# Patient Record
Sex: Female | Born: 1935 | Race: White | Hispanic: No | Marital: Married
Health system: Southern US, Community
[De-identification: ages and names within clinical notes are randomized; demographics above are authoritative.]

---

## 2005-05-04 ENCOUNTER — Ambulatory Visit: Payer: Self-pay | Admitting: Internal Medicine

## 2006-05-05 ENCOUNTER — Ambulatory Visit: Payer: Self-pay | Admitting: Internal Medicine

## 2007-05-10 ENCOUNTER — Ambulatory Visit: Payer: Self-pay | Admitting: Internal Medicine

## 2007-07-04 ENCOUNTER — Emergency Department: Payer: Self-pay | Admitting: Internal Medicine

## 2007-07-20 ENCOUNTER — Other Ambulatory Visit: Payer: Self-pay

## 2007-07-21 ENCOUNTER — Inpatient Hospital Stay: Payer: Self-pay | Admitting: Internal Medicine

## 2008-05-14 ENCOUNTER — Ambulatory Visit: Payer: Self-pay | Admitting: Internal Medicine

## 2008-06-02 ENCOUNTER — Inpatient Hospital Stay: Payer: Self-pay | Admitting: Internal Medicine

## 2009-01-27 IMAGING — CT CT CHEST W/ CM
2 series · 16 of 31 positions shown, 20 images · non-contrast
Comparison: none

REASON FOR EXAM: dyspnea
COMMENTS:

[Series 4: soft tissue · axial · 0.67mm/px · z∈[-618,-532]mm · 3 of 97 slices shown]
[im 8/97  mediastinal]
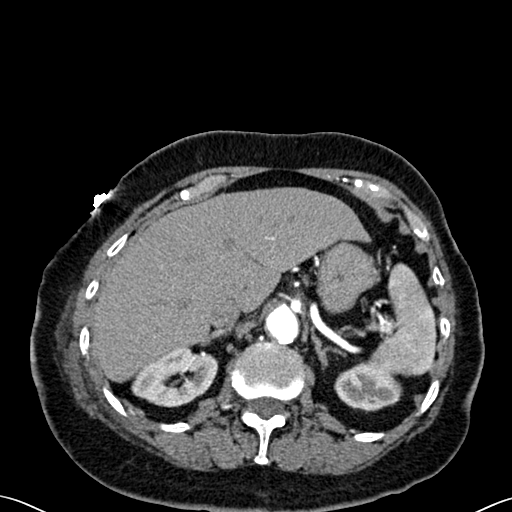
[im 23/97  mediastinal]
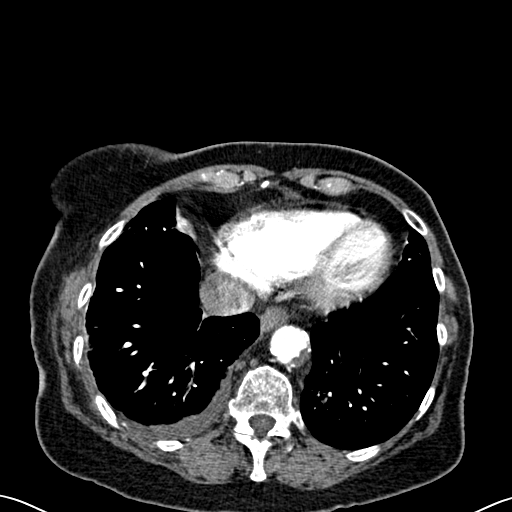
[im 37/97  mediastinal]
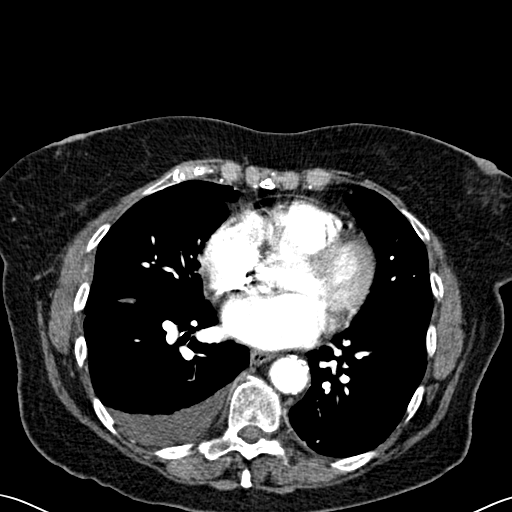

[Series 5: lung windows · axial · 0.67mm/px · z∈[-612,-376]mm · 13 of 95 slices shown, 17 images]
[im 8/95  mediastinal]
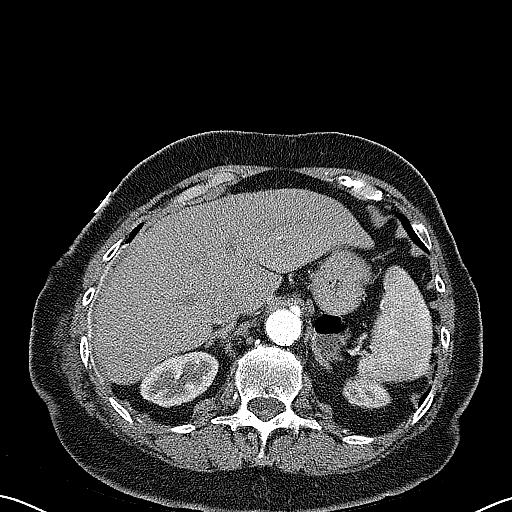
[im 8/95  lung]
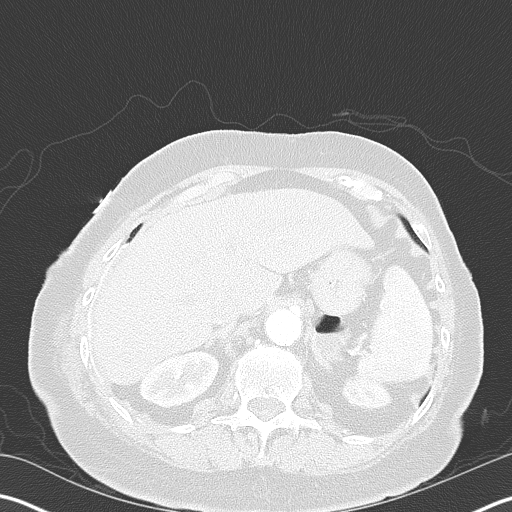
[im 15/95  lung]
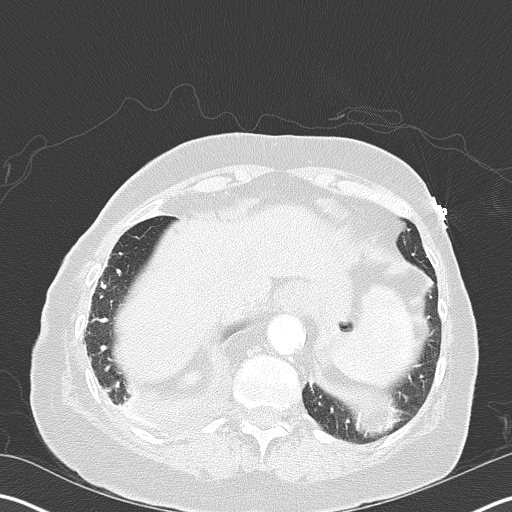
[im 22/95  lung]
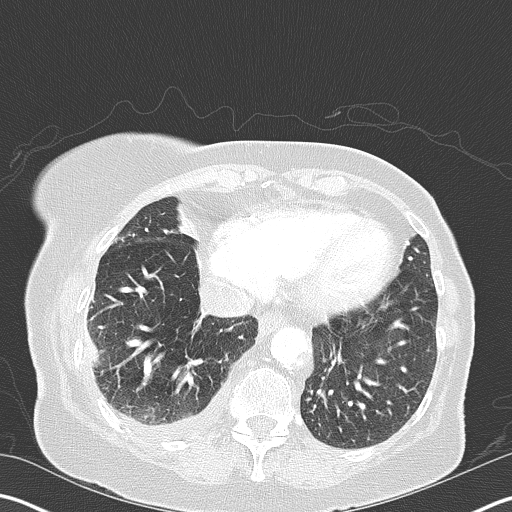
[im 29/95  lung]
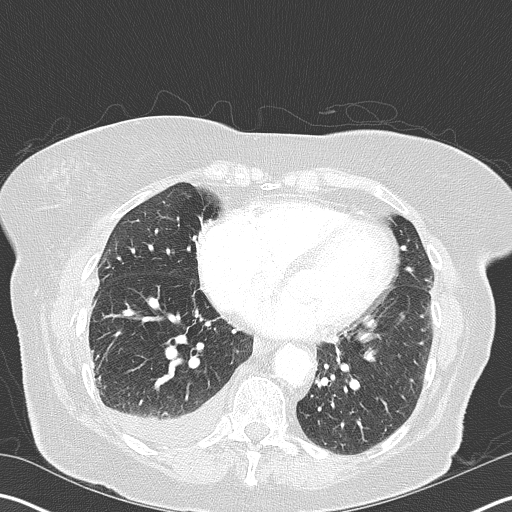
[im 37/95  mediastinal]
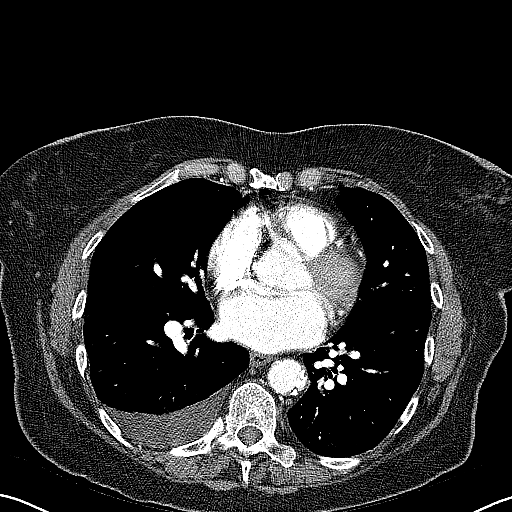
[im 37/95  lung]
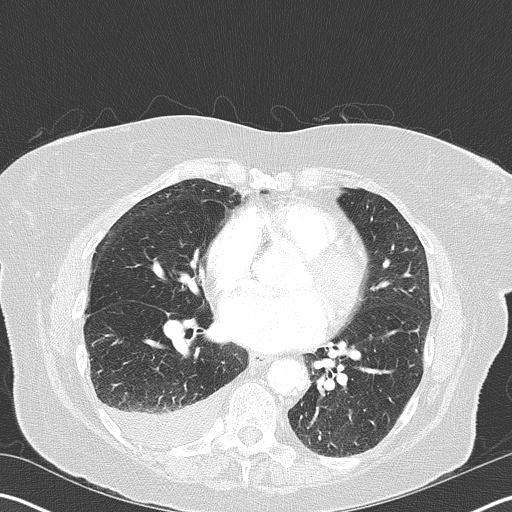
[im 44/95  lung]
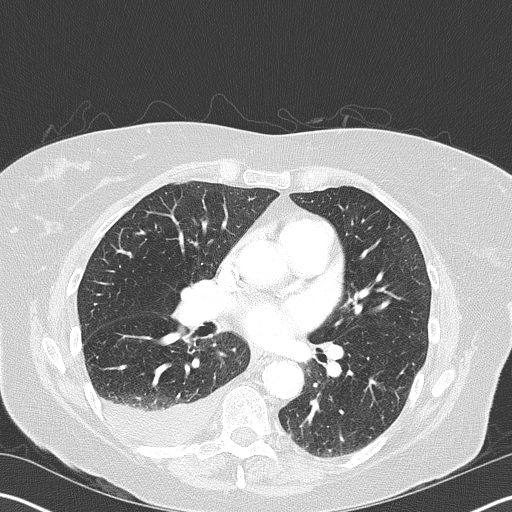
[im 48/95  lung]
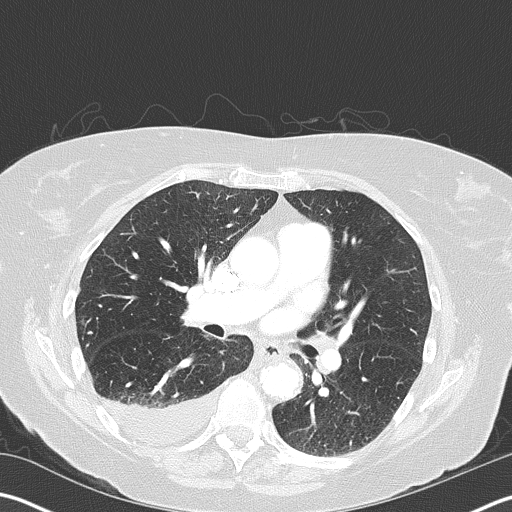
[im 51/95  lung]
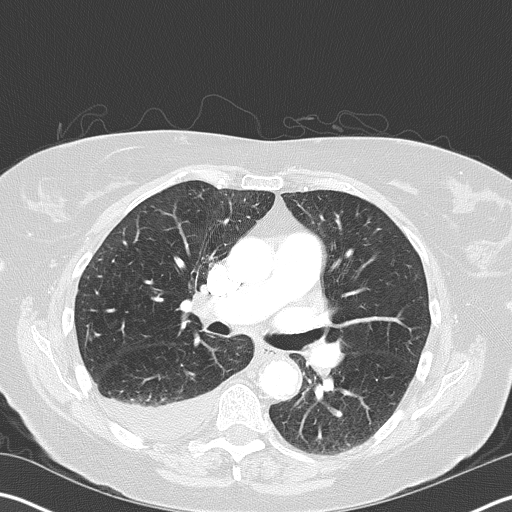
[im 58/95  mediastinal]
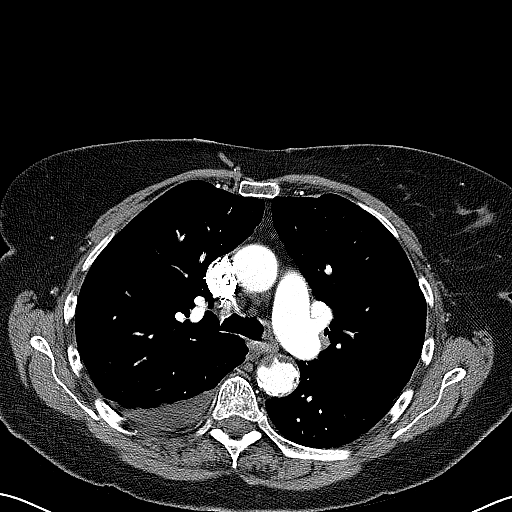
[im 58/95  lung]
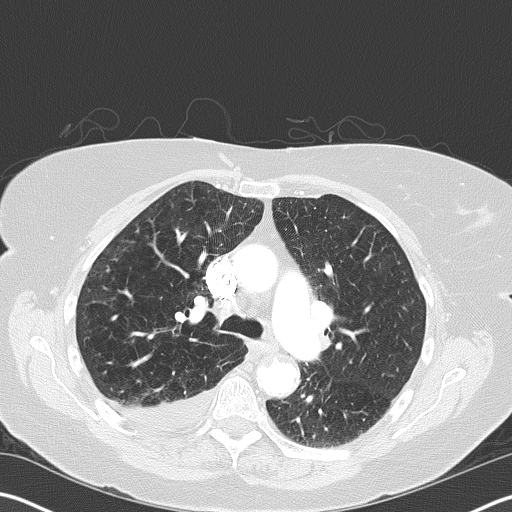
[im 66/95  lung]
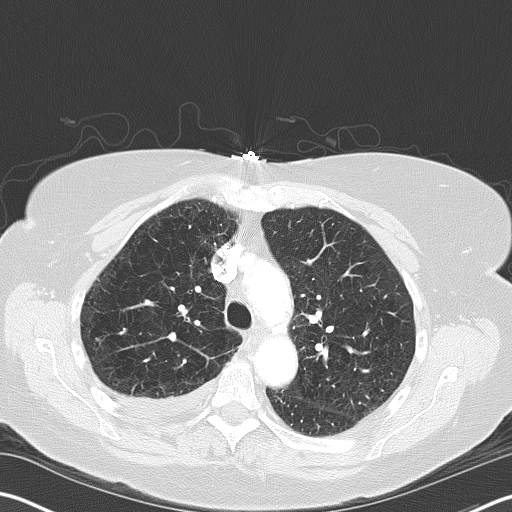
[im 73/95  lung]
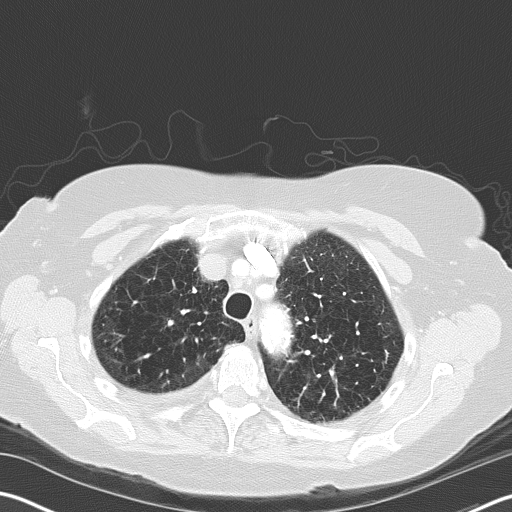
[im 80/95  lung]
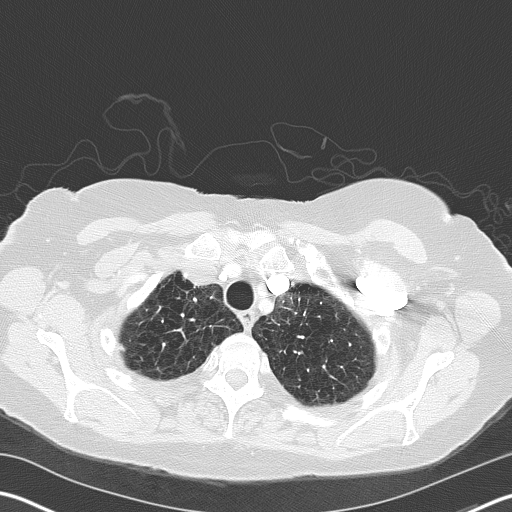
[im 87/95  mediastinal]
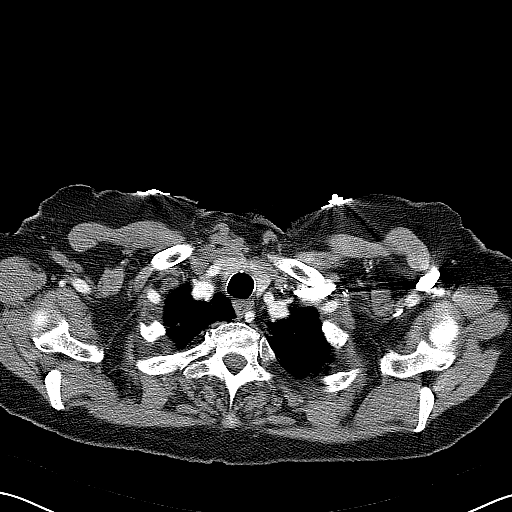
[im 87/95  lung]
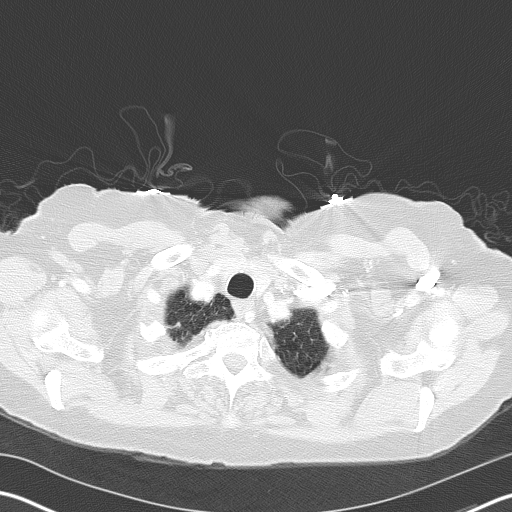

[16 of 31 positions shown; findings below may reference images not displayed]

PROCEDURE:     CT  - CT CHEST (FOR PE) W  - July 20, 2007  [DATE]

RESULT:     IV contrast enhanced Chest CT is obtained. The thoracic aorta is
atherosclerotic.  Borderline cardiomegaly and coronary artery disease is
noted.  No evidence of pulmonary embolus.  RIGHT sided pleural effusion is
noted. Diffuse interstitial prominence is noted.  No evidence of pulmonary
alveolar edema.  It may be wise to perform follow-up chest x-rays to exclude
developing congestive heart failure.
IMPRESSION: 1.     No evidence of pulmonary embolus.
2.     Coronary artery disease.
3.     Cardiomegaly with RIGHT sided pleural effusion. Mild interstitial
prominence. It may be wise to perform follow-up chest x-rays to exclude
developing congestive heart failure.  Again, there is no evidence of
pulmonary embolus.

## 2009-05-18 ENCOUNTER — Ambulatory Visit: Payer: Self-pay | Admitting: Internal Medicine

## 2010-06-17 ENCOUNTER — Ambulatory Visit: Payer: Self-pay | Admitting: Internal Medicine

## 2011-07-20 ENCOUNTER — Ambulatory Visit: Payer: Self-pay | Admitting: Internal Medicine

## 2012-02-09 ENCOUNTER — Ambulatory Visit: Payer: Self-pay | Admitting: Internal Medicine

## 2012-02-13 ENCOUNTER — Inpatient Hospital Stay: Payer: Self-pay | Admitting: Internal Medicine

## 2012-02-13 LAB — BASIC METABOLIC PANEL
Anion Gap: 7 (ref 7–16)
BUN: 50 mg/dL — ABNORMAL HIGH (ref 7–18)
Chloride: 86 mmol/L — ABNORMAL LOW (ref 98–107)
Creatinine: 1.54 mg/dL — ABNORMAL HIGH (ref 0.60–1.30)
EGFR (African American): 36 — ABNORMAL LOW
EGFR (Non-African Amer.): 31 — ABNORMAL LOW
Osmolality: 268 (ref 275–301)
Potassium: 5.7 mmol/L — ABNORMAL HIGH (ref 3.5–5.1)
Sodium: 126 mmol/L — ABNORMAL LOW (ref 136–145)

## 2012-02-13 LAB — COMPREHENSIVE METABOLIC PANEL
Anion Gap: 10 (ref 7–16)
Calcium, Total: 9.1 mg/dL (ref 8.5–10.1)
Chloride: 85 mmol/L — ABNORMAL LOW (ref 98–107)
Co2: 30 mmol/L (ref 21–32)
EGFR (African American): 40 — ABNORMAL LOW
EGFR (Non-African Amer.): 35 — ABNORMAL LOW
Osmolality: 263 (ref 275–301)
Potassium: 6.4 mmol/L — ABNORMAL HIGH (ref 3.5–5.1)
SGPT (ALT): 25 U/L (ref 12–78)
Sodium: 125 mmol/L — ABNORMAL LOW (ref 136–145)
Total Protein: 7.3 g/dL (ref 6.4–8.2)

## 2012-02-13 LAB — CBC WITH DIFFERENTIAL/PLATELET
Basophil #: 0.1 10*3/uL (ref 0.0–0.1)
Basophil %: 0.6 %
Eosinophil #: 0.1 10*3/uL (ref 0.0–0.7)
Eosinophil %: 0.2 %
Lymphocyte %: 21.5 %
MCH: 30.8 pg (ref 26.0–34.0)
MCHC: 32.7 g/dL (ref 32.0–36.0)
MCV: 94 fL (ref 80–100)
Monocyte #: 1.3 x10 3/mm — ABNORMAL HIGH (ref 0.2–0.9)
Monocyte %: 5.2 %
Neutrophil %: 72.5 %
Platelet: 171 10*3/uL (ref 150–440)
RBC: 4.5 10*6/uL (ref 3.80–5.20)
RDW: 13.3 % (ref 11.5–14.5)

## 2012-02-13 LAB — TROPONIN I: Troponin-I: 0.04 ng/mL

## 2012-02-14 LAB — APTT
Activated PTT: 153.3 secs — ABNORMAL HIGH (ref 23.6–35.9)
Activated PTT: 92.6 secs — ABNORMAL HIGH (ref 23.6–35.9)

## 2012-02-14 LAB — CBC WITH DIFFERENTIAL/PLATELET
Comment - H1-Com1: NORMAL
HCT: 39.3 % (ref 35.0–47.0)
HGB: 13 g/dL (ref 12.0–16.0)
Lymphocytes: 16 %
MCHC: 33.2 g/dL (ref 32.0–36.0)
Monocytes: 6 %
Myelocyte: 1 %
RBC: 4.18 10*6/uL (ref 3.80–5.20)
RDW: 13.3 % (ref 11.5–14.5)
Segmented Neutrophils: 75 %

## 2012-02-14 LAB — BASIC METABOLIC PANEL
Anion Gap: 8 (ref 7–16)
Calcium, Total: 8.8 mg/dL (ref 8.5–10.1)
Chloride: 85 mmol/L — ABNORMAL LOW (ref 98–107)
Co2: 34 mmol/L — ABNORMAL HIGH (ref 21–32)
Creatinine: 1.58 mg/dL — ABNORMAL HIGH (ref 0.60–1.30)
EGFR (African American): 35 — ABNORMAL LOW
Sodium: 127 mmol/L — ABNORMAL LOW (ref 136–145)

## 2012-02-14 LAB — MAGNESIUM: Magnesium: 1.8 mg/dL

## 2012-02-15 LAB — CBC WITH DIFFERENTIAL/PLATELET
Comment - H1-Com3: NORMAL
HCT: 37.8 % (ref 35.0–47.0)
Lymphocytes: 15 %
Monocytes: 4 %
Myelocyte: 1 %
Platelet: 148 10*3/uL — ABNORMAL LOW (ref 150–440)
RBC: 4.01 10*6/uL (ref 3.80–5.20)
RDW: 13.2 % (ref 11.5–14.5)
Segmented Neutrophils: 67 %
Variant Lymphocyte - H1-Rlymph: 8 %
WBC: 23.4 10*3/uL — ABNORMAL HIGH (ref 3.6–11.0)

## 2012-02-15 LAB — BASIC METABOLIC PANEL
Anion Gap: 13 (ref 7–16)
BUN: 51 mg/dL — ABNORMAL HIGH (ref 7–18)
Calcium, Total: 8.7 mg/dL (ref 8.5–10.1)
Co2: 33 mmol/L — ABNORMAL HIGH (ref 21–32)
Creatinine: 1.27 mg/dL (ref 0.60–1.30)
EGFR (African American): 46 — ABNORMAL LOW

## 2012-02-15 LAB — APTT: Activated PTT: 116.5 secs — ABNORMAL HIGH (ref 23.6–35.9)

## 2012-02-17 LAB — EXPECTORATED SPUTUM ASSESSMENT W GRAM STAIN, RFLX TO RESP C

## 2012-02-19 LAB — CULTURE, BLOOD (SINGLE)

## 2012-03-11 ENCOUNTER — Ambulatory Visit: Payer: Self-pay | Admitting: Internal Medicine

## 2012-03-11 DEATH — deceased

## 2013-08-23 IMAGING — CR DG CHEST 1V PORT
1 series · 1 of 1 positions shown · non-contrast
Comparison: none

REASON FOR EXAM: shortness of breath
COMMENTS:

[portable]
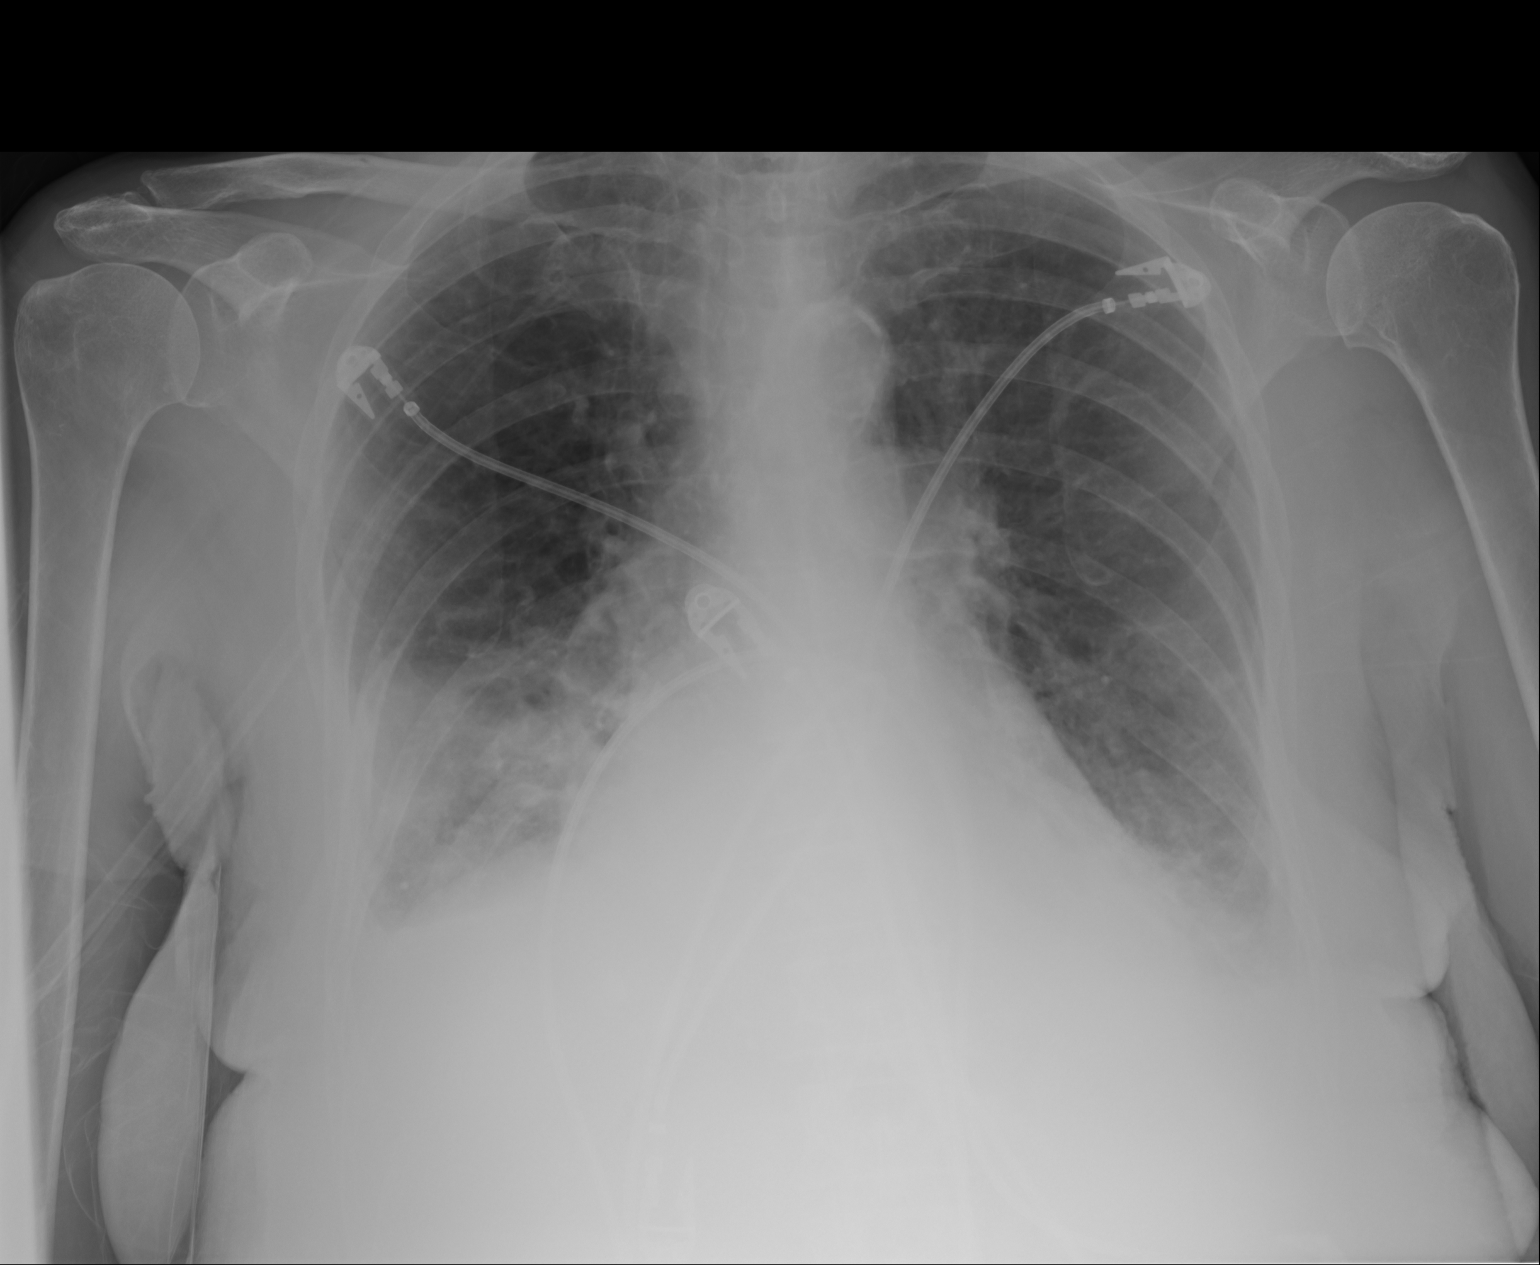

[1 of 1 positions shown; findings below may reference images not displayed]

PROCEDURE:     DXR - DXR PORTABLE CHEST SINGLE VIEW  - February 13, 2012 [DATE]

RESULT:     Comparison is made to the study 02 June, 2008.

There is increased density at the right lung base consistent with right lung
base pneumonia. The lungs are hyperinflated consistent with COPD.
Atherosclerotic calcification is present. The cardiac silhouette is
borderline enlarged. The left lung base is poorly seen.
IMPRESSION: 1.     Findings concerning for developing right lung base pneumonia in a
patient with borderline cardiomegaly and COPD.

[REDACTED]

## 2013-08-25 IMAGING — CR DG CHEST 1V PORT
1 series · 1 of 1 positions shown · non-contrast
Comparison: none

REASON FOR EXAM: COPD, Pneumonia
COMMENTS:

PROCEDURE:     DXR - DXR PORTABLE CHEST SINGLE VIEW  - February 15, 2012  [DATE]
RESULT:     Comparison is made to prior study dated 02/13/2012.

[portable]
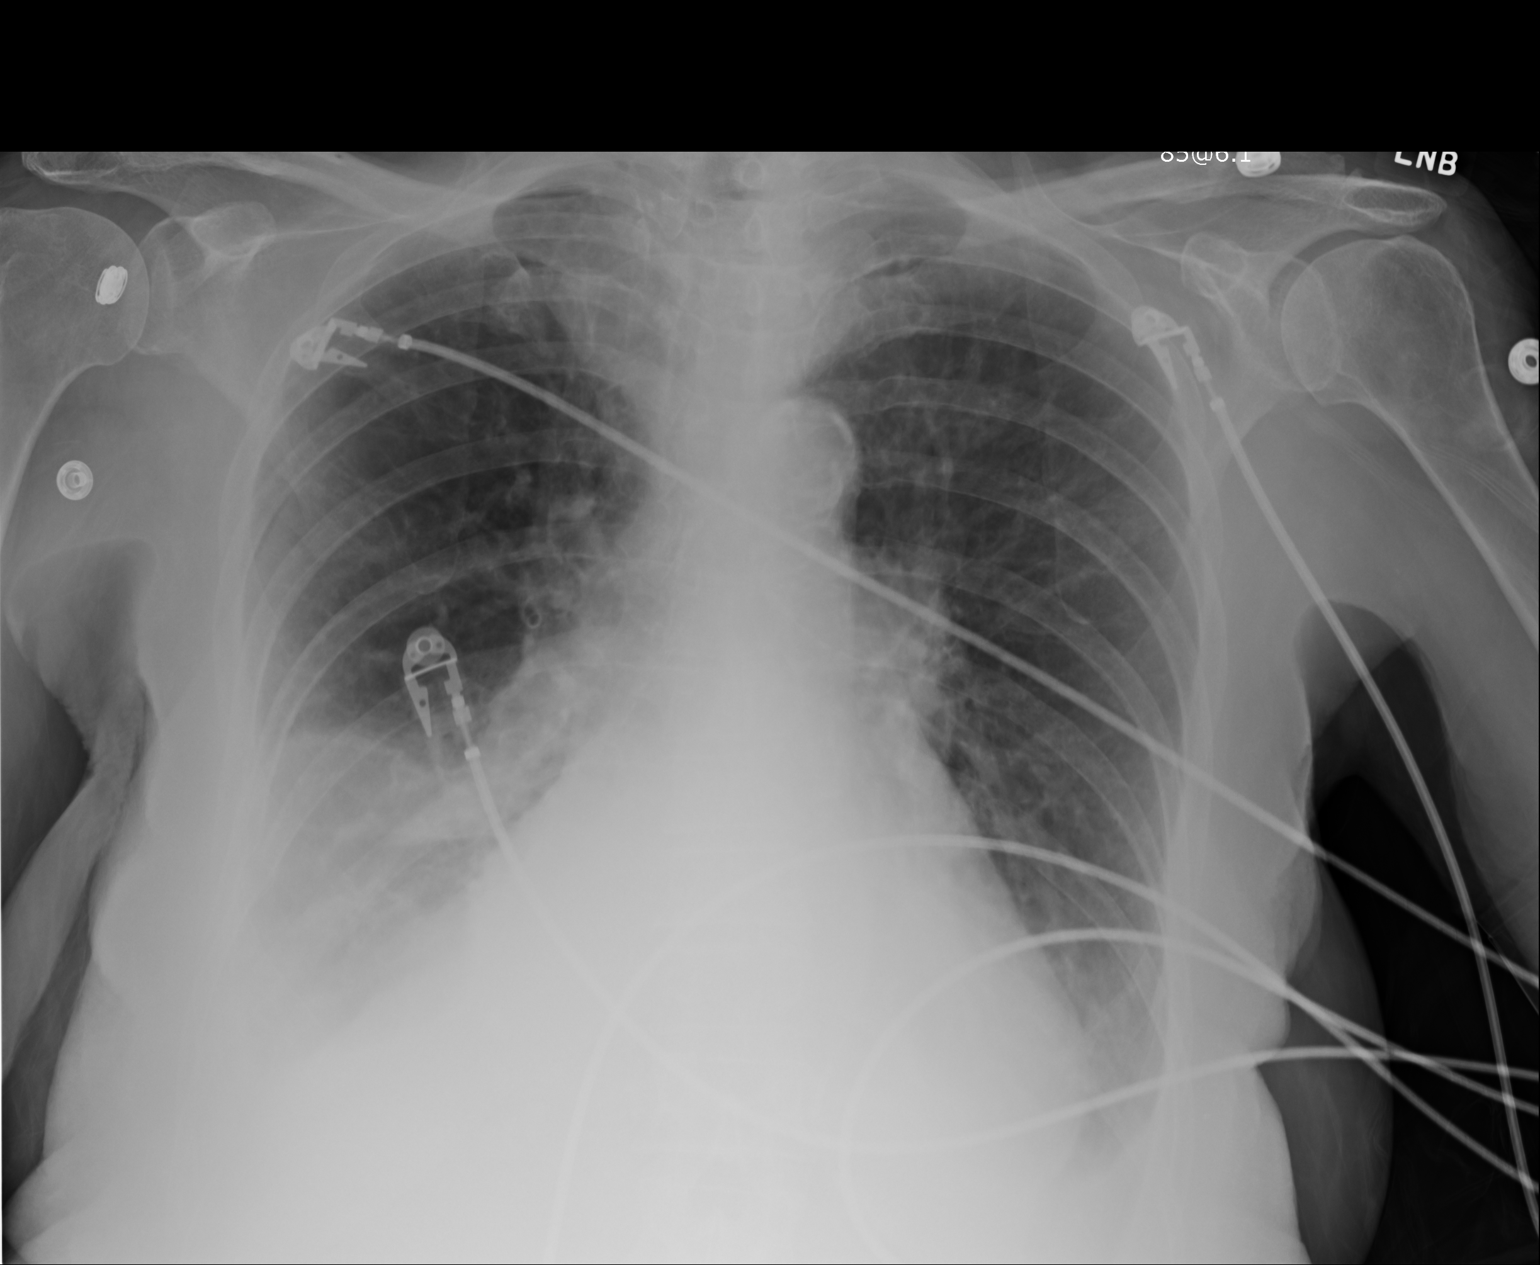

[1 of 1 positions shown; findings below may reference images not displayed]

FINDINGS: A persistent area of increased density projects in the right lung
base as well as blunting of the costophrenic angle. There is blunting the
costophrenic angle on the left. Prominence of the interstitial markings is
appreciated. The cardiac silhouette is moderately enlarged. The visualized
bony skeleton is unremarkable.
IMPRESSION: Right lower lobe infiltrate versus atelectasis with
possible small effusion. Surveillance evaluation recommended.

## 2014-10-28 NOTE — Discharge Summary (Signed)
PATIENT NAME:  Sheryl Howard, Sheryl Howard MR#:  [id number] DATE OF BIRTH:  1928-07-05  DATE OF ADMISSION:  02/13/2012 DATE OF DISCHARGE:  07/14/2011  DISCHARGE DIAGNOSES: 1. 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  2. Systemic hypertension.  3. Pneumonia.   CHIEF COMPLAINT: Shortness of breath.   HISTORY OF PRESENT ILLNESS: Sheryl Howard is an 81 year old female with history of chronic respiratory failure with chronic obstructive pulmonary disease and sleep apnea on 2 liters nasal cannula who presented complaining of progressive shortness of breath and productive cough for the last few weeks. The patient initially was given a round of antibiotics but continued to be symptomatic. She also has been having some lower extremity edema for the last few weeks and was on a diuretic. The patient had noticed that her symptoms had  progressed and was noted to have pneumonia as well as acute renal failure and hyperkalemia.   PAST MEDICAL HISTORY:  1. Congestive heart failure. 2. Advanced chronic obstructive pulmonary disease with hypercapnia and significant pulmonary hypertension. 3. History of osteoporosis.  4. Hyperlipidemia.   PHYSICAL EXAMINATION: On examination the patient was afebrile, pulse 102, respirations 25, blood pressure 127/70, and oxygen saturation 86% on oxygen. Later placed on BiPAP 12/5 and oxygen saturation improved to 95%. She was critically ill-appearing using accessory muscles of respiration. Head was normocephalic, atraumatic. Neck was supple. Positive jugular venous distention with engorged veins. Lungs with bilateral inspiratory and expiratory wheezes and decreased air entry, no crackles. Abdomen was soft and nontender. Extremities had 2+ edema.   LABS/RADIOLOGIC STUDIES: BNP was 31,044. BUN was 47, creatinine 1.41, sodium 125, potassium 6.4, chloride 85, GFR 40, and AST 42. Troponin was 0.04. WBC count was 24,000,  platelets 171, and hemoglobin 13.9.  ABG: pH 7.18, pCO2 83, pO2 61, and lactate 2.1.   X-ray showed evidence of right lower lobe pneumonia.   EKG showed atrial fibrillation with rapid ventricular rate.   HOSPITAL COURSE: The patient was initially placed on BiPAP, admitted to the Critical Care Unit. She was also started on Zosyn and IV vancomycin. The patient however continued to be hypercapnic and appeared to have bouts of obtundation where she was difficult to arouse. She was also seen by palliative care who had an extensive discussion with the family and it was decided that she be moved to the floor and subsequently to hospice home. The patient was made DO NOT RESUSCITATE and the family was in agreement with this. She was discharged to hospice home via EMS for comfort care on 07/14/2011.  ____________________________ Barbette ReichmannVishwanath Dontay Harm, MD vh:slb D: 02/17/2012 18:54:04 ET T: 02/20/2012 10:35:19 ET JOB#: [id number]  cc: Barbette ReichmannVishwanath Ronica Vivian, MD, <Dictator> Barbette ReichmannVISHWANATH Jaymien Landin MD ELECTRONICALLY SIGNED 03/05/2012 17:23

## 2014-10-28 NOTE — H&P (Signed)
PATIENT NAME:  Sheryl Howard, Sheryl Howard MR#:  [id number] DATE OF BIRTH:  1927-08-25  DATE OF ADMISSION:  02/13/2012  REFERRING PHYSICIAN: Marge DuncansPaul Melinda, MD  PRIMARY CARE PHYSICIAN: Barbette ReichmannVishwanath Hande, MD  CHIEF COMPLAINT: Shortness of breath.   HISTORY OF PRESENT ILLNESS: The patient is a pleasant 81 year old Caucasian female with history of chronic respiratory failure in the setting of obstructive sleep apnea and chronic obstructive pulmonary disease on 2 liters nasal cannula who has been having progressive shortness of breath and productive cough for several weeks. The patient states that her symptoms started several weeks ago, went to her doctor and was given a bout of antibiotics and symptoms persisted. She went back to her physician and [facility] and was given another antibiotic which she finished about eight days ago; she took 10 days of that. She has still been having symptoms of shortness of breath and productive cough without any fevers or chills. The patient also has been having lower extremity edema for the past month or so and is on a diuretic. As her symptoms progressed, she came here to the ER; however, she was found to have pneumonia as well as renal failure and hyperkalemia. The patient has been placed on BiPAP given the respiratory failure. Her family is in the room. She has multiple abnormalities and the hospitalist service was contacted for further evaluation and management.   PAST MEDICAL HISTORY: (Per patient) 1. History of congestive heart failure, unknown type.  2. History of Bell's palsy, which is resolved.  3. Osteoporosis.  4. Hypertension.  5. Chronic obstructive pulmonary disease and obstructive sleep apnea on 2 liters nasal cannula around-the-clock.   PAST SURGICAL HISTORY: Hysterectomy.   SOCIAL HISTORY: She quit smoking over a decade ago. No alcohol or drug use. She lives with her husband.   FAMILY HISTORY: Mom and dad both with myocardial  infarction and coronary artery bypass graft.   ALLERGIES: Estrogen.  OUTPATIENT MEDICATIONS:  1. Advair 250/50 one puff inhaled twice a day. 2. Atenolol 25 mg daily.  3. K-Dur 10 mg daily.  4. Simvastatin 20 mg daily.  5. Spiriva 18 mcg inhaled one cap daily.  6. Torsemide 10 mg 2 tabs once a day.  REVIEW OF SYSTEMS: CONSTITUTIONAL: No fever but there is fatigue, overall weakness. No weight changes. EYES: No blurry vision or double vision. ENT: No tinnitus, hearing loss, snoring, or postnasal drip. No sore throat or runny nose. RESPIRATORY: Positive for productive cough, shortness of breath, history of chronic obstructive pulmonary disease, and positive dyspnea on exertion. CARDIOVASCULAR: No chest pain. Positive for lower extremity edema. Some dyspnea on exertion. No syncope. No palpitations. History of high blood pressure. GI: No nausea or vomiting. Had an episode of diarrhea about a week ago, which has resolved. No blood in the stool. No melena or rectal bleeding. GENITOURINARY: Denies dysuria or hematuria. HEME/LYMPH: Denies anemia or easy bruising. SKIN: Positive for rash for about six months being managed by a dermatologist, on the lower extremities. MUSCULOSKELETAL: No muscle or joint pain currently. She has a history of osteoporosis. NEUROLOGIC: No focal weakness or numbness. No history of cerebrovascular accident or transient ischemic attack. PSYCHIATRIC: No anxiety or depression.   PHYSICAL EXAMINATION:   VITAL SIGNS: Temperature on arrival 98.2, pulse rate 102 and when Howard was in the room was 108 to 120, respiratory rate on arrival 25 and currently 22, blood pressure 127/70, and oxygen saturation 86% on oxygen on admission. Currently she is  on BiPAP 12/5 and respiratory rate is 22, FiO2 50%, and oxygen saturations are 95% on the BiPAP.   GENERAL: The patient is a critically ill-appearing Caucasian female sitting in bed using accessory muscle, on the BiPAP.   HEENT: Normocephalic,  atraumatic. Pupils are equal and reactive. Dry mucous membranes. Anicteric sclerae. Extraocular muscles intact.   NECK: Supple. Positive for JVD and engorged neck veins.   CARDIOVASCULAR: S1 and S2 diminished heart sounds, tachycardic. No murmurs, rubs, or gallops appreciated.   LUNGS: Bilateral wheezing and decreased air entry in both lungs. No crackles. Positive for rales in the right lung as well.   ABDOMEN: Soft, nontender, and nondistended. No organomegaly.   EXTREMITIES: Positive for edema, 2+. There is also about a 1.5 cm bulla on the left lower extremity, lateral side, and positive for circular flat rash about 1 cm in diameter, multiple areas involved, bilateral lower extremities.   NEUROLOGIC: Cranial nerves II through XII grossly intact. Strength is 5/5 in all extremities.   PSYCH: Awake, alert, and oriented three, cooperative.  LABORATORY, DIAGNOSTIC AND RADIOLOGIC DATA: BNP 31,044. BUN 47, creatinine 1.41, sodium 125, potassium 6.4, and chloride 85. GFR 40. AST is 42, otherwise LFTs within normal limits. Troponin 0.04. WBC 24,000, platelets 171, and hemoglobin 13.9.  pH 7.18, pCO2 83, pO2 61, and lactate 2.1.   X-ray findings are concerning for developing right lung base pneumonia in a patient with borderline cardiomegaly and chronic obstructive pulmonary disease.  EKG: Atrial fibrillation with RVR, rate is 103, right axis deviation, nonspecific ST changes. No acute ST elevations or depressions.   ASSESSMENT AND PLAN: We have a pleasant 81 year old Caucasian female with chronic respiratory failure on 2 liters of oxygen around-the-clock, history of chronic obstructive pulmonary disease and obstructive sleep apnea, hypertension with progressive shortness of breath and treatment for presumable pneumonia with two bouts of antibiotics in the last month with progressive shortness of breath and productive cough as well as congestive heart failure symptoms with multiple medical problems  currently.  1. Acute on chronic respiratory failure. The patient appears to have hypoxic as well as hypercapnic respiratory failure given the ABG results and lung findings and the hypoxia. It appears that the respiratory failure is multifactorial at this point. The patient does have pneumonia and as she has failed outpatient therapy would start her on vancomycin and Zosyn. She has gotten a ceftriaxone dose here. We will do pharmacy dosing given the renal failure. We would send for urine strep and Legionella antigen and provide oxygen as well as BiPAP. In regards to the COPD, She is wheezing and there are decreased breath sounds in her lungs. She has had some nebulizer treatments here. We would continue p.r.n. nebulizers as well as Advair and Spiriva and continue the BiPAP. Would also obtain a pulmonary consult. In regards to the congestive heart failure, we have an unknown baseline. She is on torsemide and has history suggestive of congestive heart failure symptoms. However, given the renal failure Howard would be very careful with a diuretic at this point. She does have signs and symptoms of congestive heart failure. Howard would obtain a cardiology consult and get an echocardiogram. Howard think that the fluid balance in this patient would be very difficult given the sepsis and renal failure as well as congestive heart failure.  2. SIRS criteria with likely sepsis in the setting of a pneumonia. The patient does have tachycardia and leukocytosis as well as tachypnea on arrival. Pneumonia likely is the source. Again, antibiotics  and cultures and ICU admission.  3. Renal failure. We have an unknown baseline as there are no recent labs here. It is probably in the setting of above. We have to be careful with IV fluids as the patient appears to have clinical congestive heart failure as well with BNP elevated at greater than 30,000, lower extremity edema, and being on torsemide. Howard would monitor ins and outs and place a Foley for  now. If the kidney function worsens, we would consider nephrology consult.  4. Hyperkalemia. This likely is in the setting of renal failure and potassium supplements. Would follow with BMP for repeat potassium check as she has already received Kayexalate, nebulizers, insulin, D50, and calcium gluconate.  5. Hyponatremia. This likely is multifactorial, again with p.o. intake and sepsis. Howard would be very careful with IV fluids at this point given the congestive heart failure picture. Howard would monitor the sodium.  6. Atrial fibrillation. EKG here shows that the patient has atrial fibrillation; however, the patient does have no cardiologist as an outpatient and no history of prior to that she knows of. It is possible that this is new onset. Howard would continue the atenolol for now and get a cardiology consult and start her on a heparin drip as well. Howard would also place her on telemetry and check         her heart rate an response to beta blocker. Overall, the patient is in critical condition with multiorgan failure and at high chance of cardiopulmonary arrest. Howard have discussed code status with the patient and she is DNR/DNI.   CRITICAL CARE TIME SPENT: 70 minutes. ____________________________ Krystal Eaton, MD sa:slb D: 02/13/2012 14:41:54 ET T: 02/13/2012 15:12:33 ET JOB#: [id number]  cc: Krystal Eaton, MD, <Dictator> Barbette Reichmann, MD Krystal Eaton MD ELECTRONICALLY SIGNED 03/02/2012 15:43

## 2014-10-28 NOTE — Consult Note (Signed)
PATIENT NAME:  Sheryl Howard, Sheryl Howard MR#:  [id number] DATE OF BIRTH:  13-Jun-1928  DATE OF CONSULTATION:  02/13/2012  REFERRING PHYSICIAN:  Krystal EatonShayiq Ahmadzia, MD CONSULTING PHYSICIAN:  Marcina MillardAlexander Delorse Shane, MD  PRIMARY CARE PHYSICIAN: Barbette ReichmannVishwanath Hande, MD  CHIEF COMPLAINT: Shortness of breath.   REASON FOR CONSULTATION: The patient is referred for consultation for evaluation of atrial fibrillation and congestive heart failure.   HISTORY OF PRESENT ILLNESS: The patient is an 81 year old female with history of obstructive sleep apnea and chronic obstructive pulmonary disease, on chronic oxygen therapy. The patient has had a two week history of declining health with increasing shortness of breath and productive cough, treated as an outpatient for probable bronchitis. The patient has had increase in oxygen requirements and presented [facility] and is now admitted for further treatment and evaluation. On admission, the patient was noted to be in atrial fibrillation with a rapid rate of 100 to 110 beats per minute. The patient denies chest pain or palpitations.   PAST MEDICAL HISTORY:  1. Congestive heart failure, possible diastolic dysfunction.  2. Chronic obstructive pulmonary disease, on 2 liters.  3. Obstructive sleep apnea.  4. History of Bell's palsy.  5. Hypertension.   MEDICATIONS ON ADMISSION:  1. Atenolol 25 mg daily.  2. K-Dur 10 milliequivalents daily.  3. Simvastatin 20 mg daily.  4. Torsemide 20 mg daily.  5. Spiriva 18 mcg inhaled daily. 6. Advair 250/50 one puff twice a day.   SOCIAL HISTORY: The patient currently lives with her husband. She quit tobacco abuse over 10 years ago.   FAMILY HISTORY: Positive for myocardial infarction.   REVIEW OF SYSTEMS: CONSTITUTIONAL: No fever or chills. EYES: No blurry vision. EARS: No hearing loss. RESPIRATORY: Shortness of breath and productive cough. CARDIOVASCULAR: No chest pain. GASTROINTESTINAL: No nausea, vomiting, or diarrhea. GU:  No dysuria or hematuria. ENDOCRINE: No polyuria or polydipsia. HEMATOLOGICAL: No easy bruising or bleeding. INTEGUMENT: No rash. MUSCULOSKELETAL: No arthralgias or myalgias. NEUROLOGICAL: No focal muscle weakness or numbness. PSYCHOLOGICAL: No anxiety or depression.   PHYSICAL EXAMINATION:   VITAL SIGNS: Blood pressure 112/57, pulse 102 and irregular-irregular, respirations 20, temperature 97, and pulse oximetry 94%.   HEENT: Pupils are equal and reactive to light and accommodation.   NECK: Supple without thyromegaly.   LUNGS: Scattered expiratory wheezes.   CARDIOVASCULAR: Normal jugular venous pressure. Normal point of maximal impulse. Irregular, irregular rhythm. Normal S1 and S2. No appreciable gallop, murmur, or rub.   ABDOMEN: Soft and nontender. Pulses were intact bilaterally.   MUSCULOSKELETAL: Normal muscle tone.   NEUROLOGIC: The patient is alert and in moderate respiratory distress. Motor and sensory are both grossly intact.   IMPRESSION: This is an 81 year old female with known chronic obstructive pulmonary disease and obstructive sleep apnea who presents with respiratory failure progressive over two weeks with atrial fibrillation and with mildly elevated rate. Initial troponin is negative.    RECOMMENDATIONS:  1. Agree with overall current therapy.  2. Continue atenolol. If needed may up titrate to control rate.  3. Agree with heparin drip for now.  4. Pending the patient's clinical course, may need to consider chronic anticoagulation. We will defer for now.  5. Review 2-D echocardiogram. 6. Furosemide or Torsemide IV p.r.n. ____________________________ Marcina MillardAlexander Georga Stys, MD ap:slb D: 02/13/2012 21:15:32 ET T: 02/14/2012 10:17:40 ET JOB#: [id number]  cc: Marcina MillardAlexander Owin Vignola, MD, <Dictator> Marcina MillardALEXANDER Philamena Kramar MD ELECTRONICALLY SIGNED 02/19/2012 10:54

## 2014-10-28 NOTE — Consult Note (Signed)
Brief Consult Note: Diagnosis: AF, rate stable for now, CHADS 2 2.   Patient was seen by consultant.   Consult note dictated.   Comments: REC  Agree with current therapy, cont atenolol, uptitrate if needed, rate stable at present, review echo, await pulmonary consult.  Electronic Signatures: Marcina MillardParaschos, [place] Agent (MD)  (Signed 05-Aug-13 21:17)  Authored: Brief Consult Note   Last Updated: 05-Aug-13 21:17 by Marcina MillardParaschos, Sarkis Rhines (MD)
